# Patient Record
Sex: Male | Born: 1990 | Hispanic: Yes | Marital: Single | Smoking: Never smoker
Health system: Southern US, Community
[De-identification: ages and names within clinical notes are randomized; demographics above are authoritative.]

---

## 2019-01-11 ENCOUNTER — Other Ambulatory Visit: Payer: Self-pay

## 2019-01-11 ENCOUNTER — Encounter: Payer: Self-pay | Admitting: Emergency Medicine

## 2019-01-11 ENCOUNTER — Emergency Department
Admission: EM | Admit: 2019-01-11 | Discharge: 2019-01-11 | Disposition: A | Discharge status: Home / Self Care | Payer: Self-pay | Attending: Emergency Medicine | Admitting: Emergency Medicine

## 2019-01-11 DIAGNOSIS — Z5321 Procedure and treatment not carried out due to patient leaving prior to being seen by health care provider: Secondary | ICD-10-CM | POA: Insufficient documentation

## 2019-01-11 DIAGNOSIS — R51 Headache: Secondary | ICD-10-CM | POA: Insufficient documentation

## 2019-01-11 DIAGNOSIS — F1010 Alcohol abuse, uncomplicated: Secondary | ICD-10-CM | POA: Insufficient documentation

## 2019-01-11 DIAGNOSIS — R1110 Vomiting, unspecified: Secondary | ICD-10-CM | POA: Insufficient documentation

## 2019-01-11 LAB — URINALYSIS, COMPLETE (UACMP) WITH MICROSCOPIC
Bilirubin Urine: NEGATIVE
Glucose, UA: NEGATIVE mg/dL
Hgb urine dipstick: NEGATIVE
Ketones, ur: NEGATIVE mg/dL
Leukocytes,Ua: NEGATIVE
Nitrite: NEGATIVE
Protein, ur: 100 mg/dL — AB
Specific Gravity, Urine: 1.02 (ref 1.005–1.030)
pH: 5 (ref 5.0–8.0)

## 2019-01-11 MED ORDER — SODIUM CHLORIDE 0.9% FLUSH: 3.0000 mL | Freq: Once | INTRAVENOUS | Status: DC

## 2019-01-11 NOTE — ED Notes (Signed)
Pt not present when rounding in waiting room

## 2019-01-11 NOTE — ED Notes (Signed)
Pt refusing to have labs drawn at this time.

## 2019-01-11 NOTE — ED Triage Notes (Signed)
Pt states HA 8/10 that started today, and 10episodes of vomiting, pt states SO's father currently being r/o for Covid. Pt appears uncomfortable at this time. Pt reports yesterday he had ETOH use and unknown drug use, pt reports drinking more than 24 beers and did possible cocaine.

## 2019-01-11 NOTE — ED Notes (Signed)
Called for pt for a third time in waiting room, no answer

## 2019-04-07 ENCOUNTER — Emergency Department: Payer: No Typology Code available for payment source

## 2019-04-07 ENCOUNTER — Emergency Department
Admission: EM | Admit: 2019-04-07 | Discharge: 2019-04-07 | Disposition: A | Discharge status: Home / Self Care | Payer: No Typology Code available for payment source | Attending: Emergency Medicine | Admitting: Emergency Medicine

## 2019-04-07 ENCOUNTER — Encounter: Payer: Self-pay | Admitting: Radiology

## 2019-04-07 ENCOUNTER — Other Ambulatory Visit: Payer: Self-pay

## 2019-04-07 DIAGNOSIS — Y999 Unspecified external cause status: Secondary | ICD-10-CM | POA: Diagnosis not present

## 2019-04-07 DIAGNOSIS — F10929 Alcohol use, unspecified with intoxication, unspecified: Secondary | ICD-10-CM

## 2019-04-07 DIAGNOSIS — R1011 Right upper quadrant pain: Secondary | ICD-10-CM | POA: Diagnosis not present

## 2019-04-07 DIAGNOSIS — Y92411 Interstate highway as the place of occurrence of the external cause: Secondary | ICD-10-CM | POA: Diagnosis not present

## 2019-04-07 DIAGNOSIS — Y93I9 Activity, other involving external motion: Secondary | ICD-10-CM | POA: Diagnosis not present

## 2019-04-07 DIAGNOSIS — Y908 Blood alcohol level of 240 mg/100 ml or more: Secondary | ICD-10-CM | POA: Diagnosis not present

## 2019-04-07 LAB — COMPREHENSIVE METABOLIC PANEL
ALT: 126 U/L — ABNORMAL HIGH (ref 0–44)
AST: 98 U/L — ABNORMAL HIGH (ref 15–41)
Albumin: 4.9 g/dL (ref 3.5–5.0)
Alkaline Phosphatase: 92 U/L (ref 38–126)
Anion gap: 14 (ref 5–15)
BUN: 11 mg/dL (ref 6–20)
CO2: 22 mmol/L (ref 22–32)
Calcium: 10.1 mg/dL (ref 8.9–10.3)
Chloride: 106 mmol/L (ref 98–111)
Creatinine, Ser: 0.96 mg/dL (ref 0.61–1.24)
GFR calc Af Amer: >60 mL/min (ref >60)
GFR calc non Af Amer: >60 mL/min (ref >60)
Glucose, Bld: 106 mg/dL — ABNORMAL HIGH (ref 70–99)
Potassium: 3.4 mmol/L — ABNORMAL LOW (ref 3.5–5.1)
Sodium: 142 mmol/L (ref 135–145)
Total Bilirubin: 0.7 mg/dL (ref 0.3–1.2)
Total Protein: 9.4 g/dL — ABNORMAL HIGH (ref 6.5–8.1)

## 2019-04-07 LAB — CBC
HCT: 49.7 % (ref 39.0–52.0)
Hemoglobin: 17.1 g/dL — ABNORMAL HIGH (ref 13.0–17.0)
MCH: 30.8 pg (ref 26.0–34.0)
MCHC: 34.4 g/dL (ref 30.0–36.0)
MCV: 89.5 fL (ref 80.0–100.0)
Platelets: 424 10*3/uL — ABNORMAL HIGH (ref 150–400)
RBC: 5.55 MIL/uL (ref 4.22–5.81)
RDW: 11.9 % (ref 11.5–15.5)
WBC: 12.3 10*3/uL — ABNORMAL HIGH (ref 4.0–10.5)
nRBC: 0 % (ref 0.0–0.2)

## 2019-04-07 LAB — ETHANOL: Alcohol, Ethyl (B): 337 mg/dL (ref <10)

## 2019-04-07 MED ORDER — IOHEXOL 300 MG/ML  SOLN
100.0000 mL | Freq: Once | INTRAMUSCULAR | Status: AC | PRN
  Administered 2019-04-07: 100 mL via INTRAVENOUS
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
